# Patient Record
Sex: Male | Born: 1965 | Race: White | Hispanic: No | Marital: Single | Smoking: Current every day smoker
Health system: Southern US, Community
[De-identification: ages and names within clinical notes are randomized; demographics above are authoritative.]

## PROBLEM LIST (undated history)

## (undated) DIAGNOSIS — F419 Anxiety disorder, unspecified: Secondary | ICD-10-CM

## (undated) HISTORY — PX: CORONARY ANGIOPLASTY WITH STENT PLACEMENT: SHX49

---

## 2005-08-14 ENCOUNTER — Emergency Department: Payer: Self-pay | Admitting: Emergency Medicine

## 2005-08-14 ENCOUNTER — Other Ambulatory Visit: Payer: Self-pay

## 2008-03-08 ENCOUNTER — Emergency Department: Payer: Self-pay | Admitting: Emergency Medicine

## 2008-06-06 ENCOUNTER — Inpatient Hospital Stay: Payer: Self-pay | Admitting: Unknown Physician Specialty

## 2008-06-06 ENCOUNTER — Other Ambulatory Visit: Payer: Self-pay

## 2010-05-13 ENCOUNTER — Emergency Department: Payer: Self-pay

## 2010-08-18 ENCOUNTER — Emergency Department: Payer: Self-pay | Admitting: Emergency Medicine

## 2010-08-21 ENCOUNTER — Emergency Department: Payer: Self-pay | Admitting: Emergency Medicine

## 2011-08-27 IMAGING — CT CT HEAD WITHOUT CONTRAST
2 series · 16 of 30 positions shown, 20 images · non-contrast
Comparison: none

REASON FOR EXAM: headache
COMMENTS:   May transport without cardiac monitor

[Series 2: without · axial · non-contrast · 0.42mm/px · z∈[+130,+255]mm · 13 of 31 slices shown, 17 images]
[im 3/31  brain]
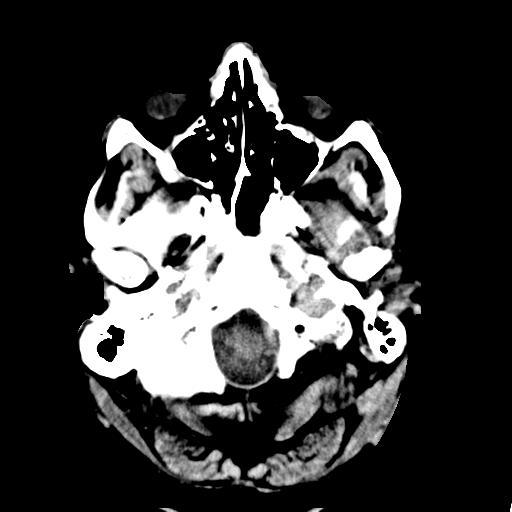
[im 3/31  bone]
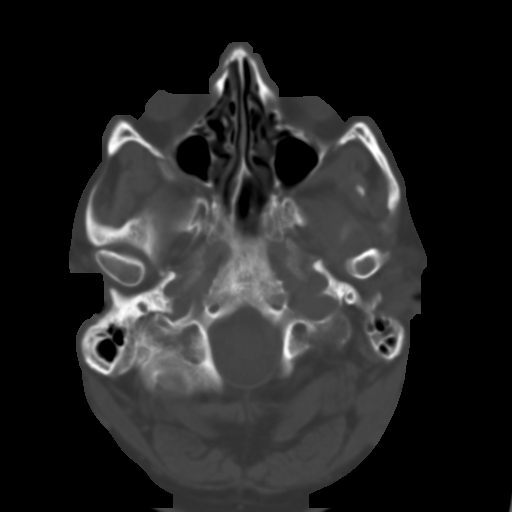
[im 5/31  brain]
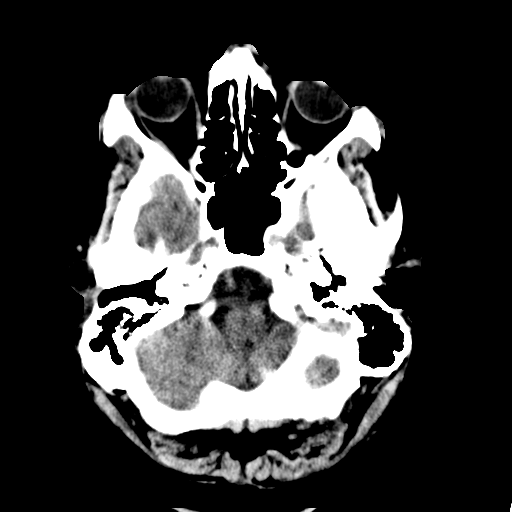
[im 7/31  brain]
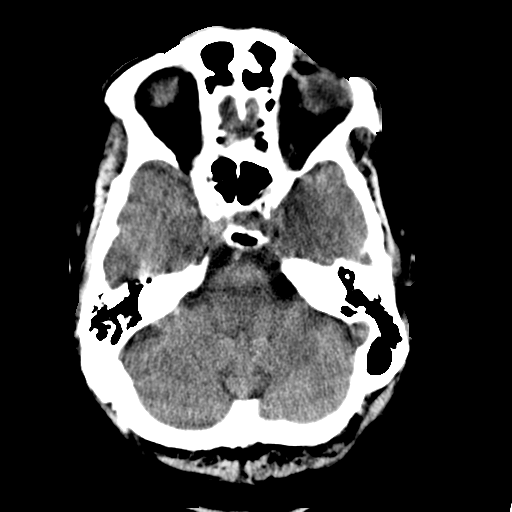
[im 9/31  brain]
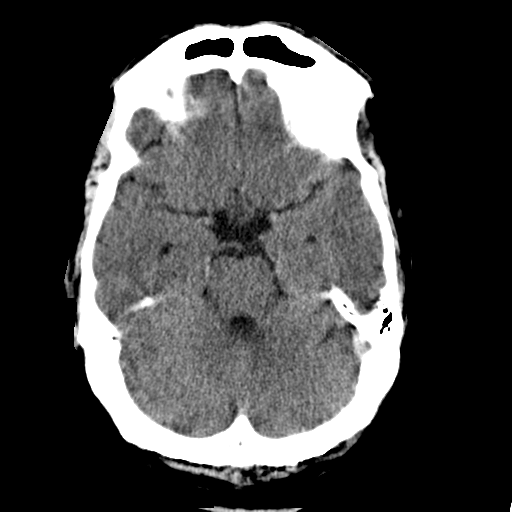
[im 11/31  brain]
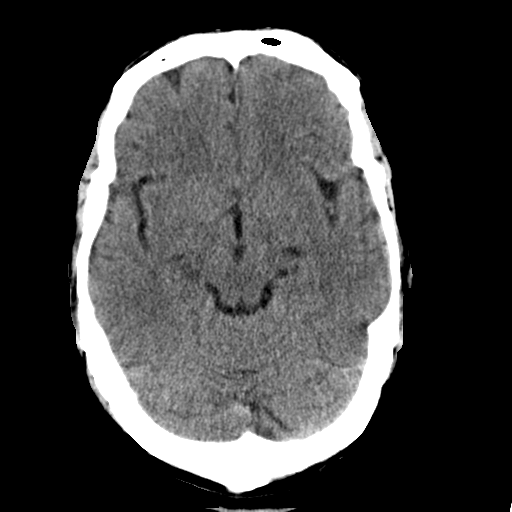
[im 11/31  bone]
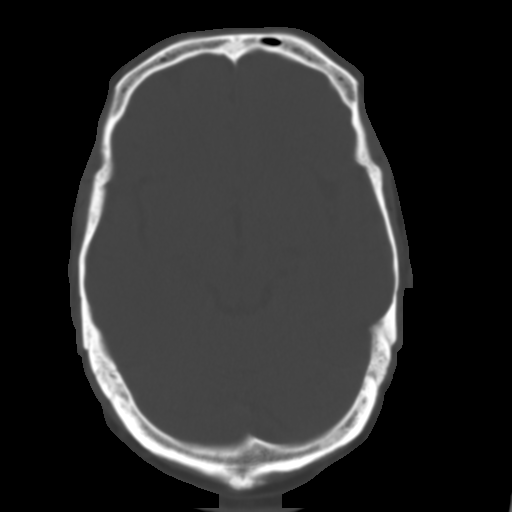
[im 13/31  brain]
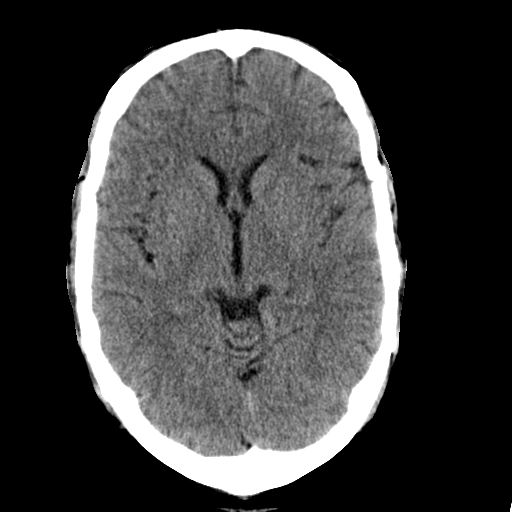
[im 16/31  brain]
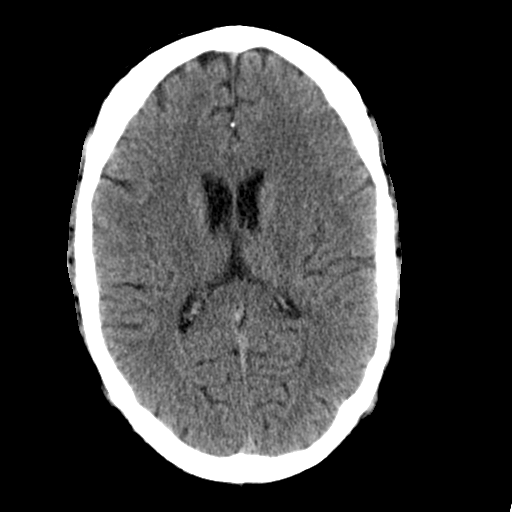
[im 18/31  brain]
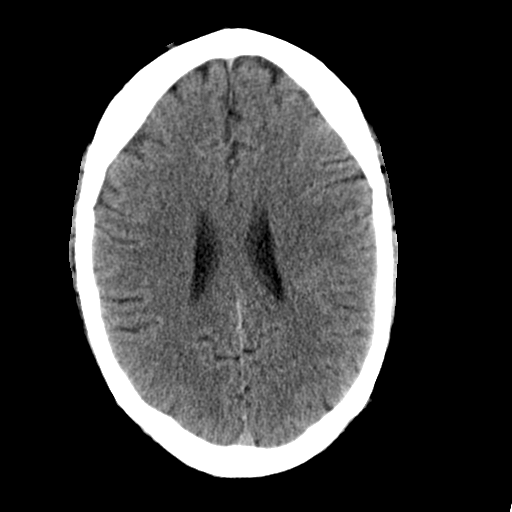
[im 20/31  brain]
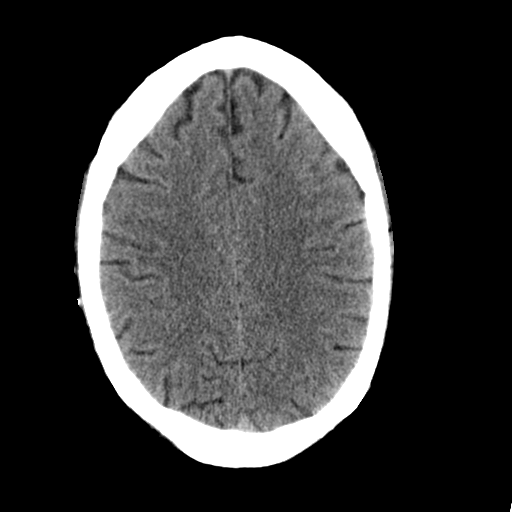
[im 20/31  bone]
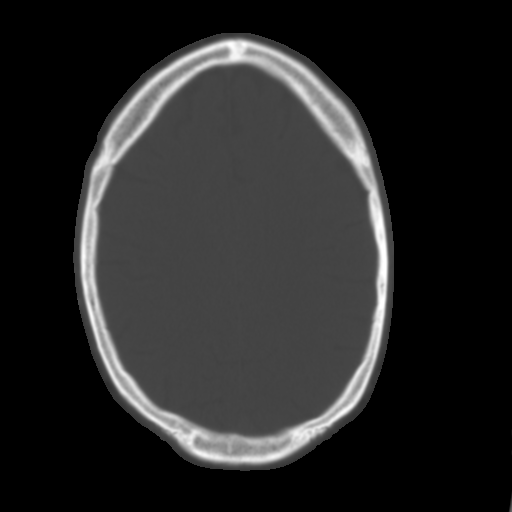
[im 22/31  brain]
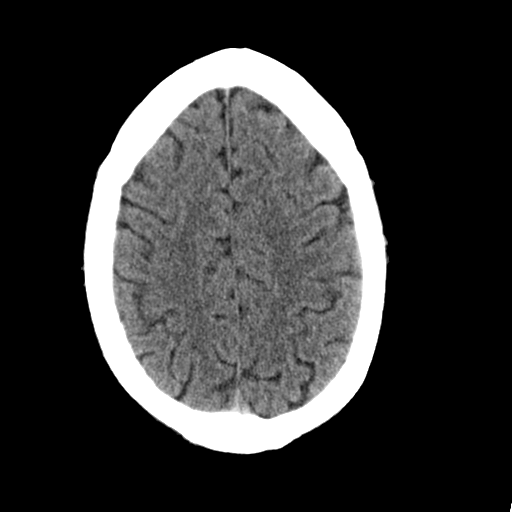
[im 24/31  brain]
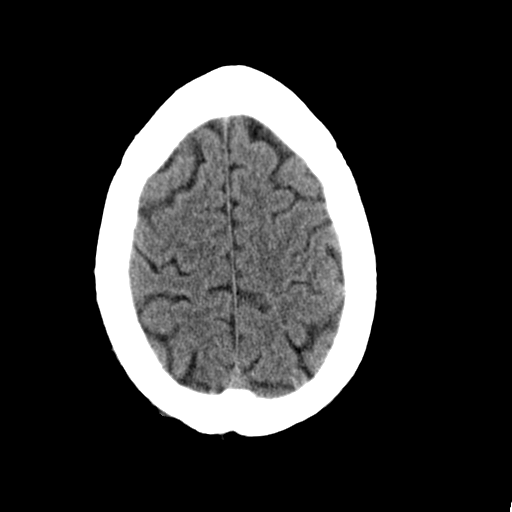
[im 26/31  brain]
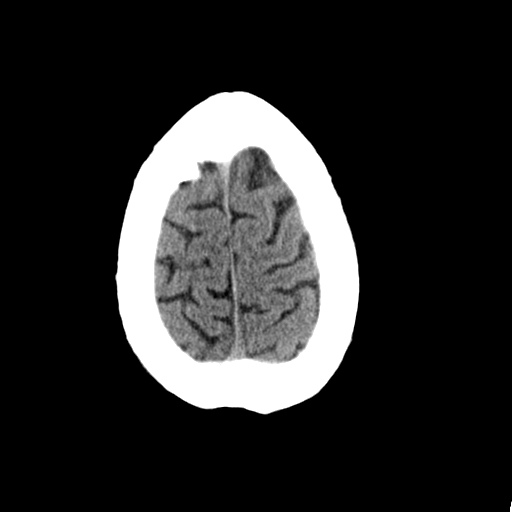
[im 28/31  brain]
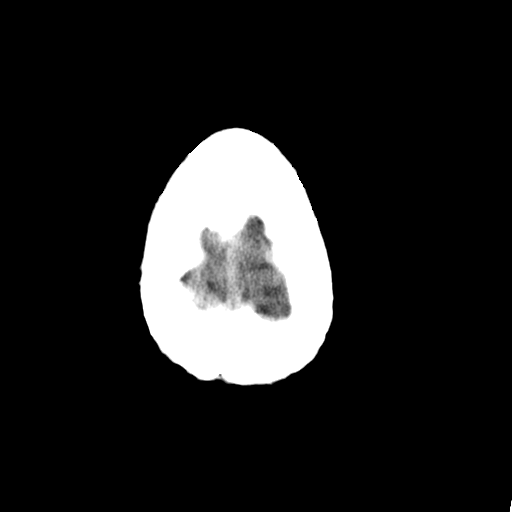
[im 28/31  bone]
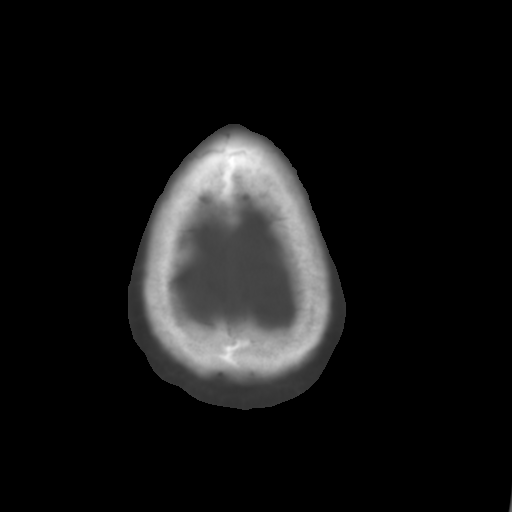

[Series 3: bone · axial · 0.42mm/px · z∈[+130,+170]mm · 3 of 31 slices shown]
[im 3/31  bone]
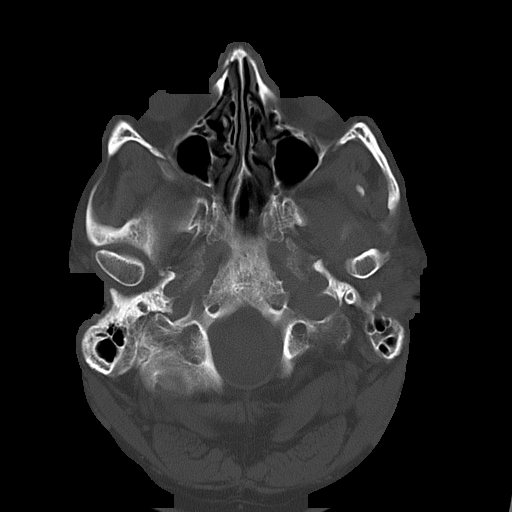
[im 7/31  bone]
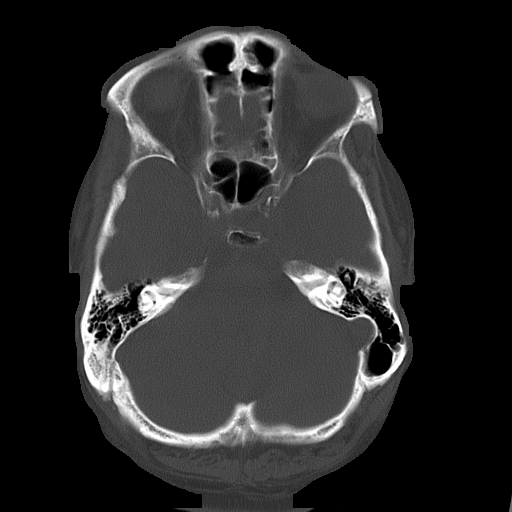
[im 11/31  bone]
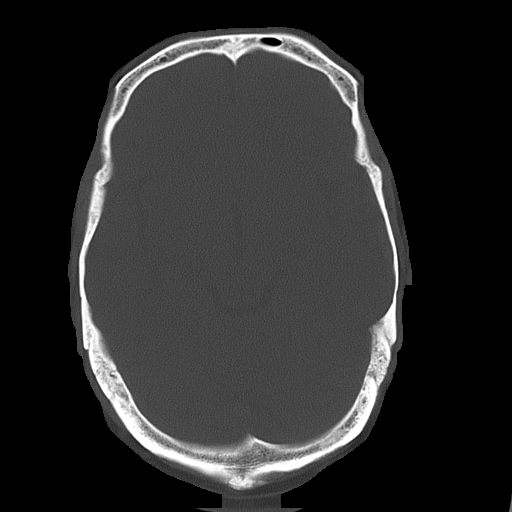

[16 of 30 positions shown; findings below may reference images not displayed]

PROCEDURE:     CT  - CT HEAD WITHOUT CONTRAST  - August 19, 2010  [DATE]

RESULT:     Noncontrast emergent CT of the brain is compared to the most
recent exam of 06/06/2008.

The ventricles and sulci are normal. There is no hemorrhage. There is no
focal mass, mass-effect or midline shift. There is no evidence of edema or
territorial infarct. The bone windows demonstrate normal aeration of the
paranasal sinuses and mastoid air cells. There is no skull fracture
demonstrated.
IMPRESSION: 1. No acute intracranial abnormality. Stable appearance.

## 2011-08-27 IMAGING — CR DG CHEST 2V
1 series · 2 of 2 positions shown · non-contrast
Comparison: none

REASON FOR EXAM: hypertension
COMMENTS:   May transport without cardiac monitor

PROCEDURE:     DXR - DXR CHEST PA (OR AP) AND LATERAL  - August 19, 2010  [DATE]
RESULT:     Comparison: 03/08/2008

[Series 1: view not recorded · 0.17mm/px · 2 of 2 slices shown]
[im 1/2]
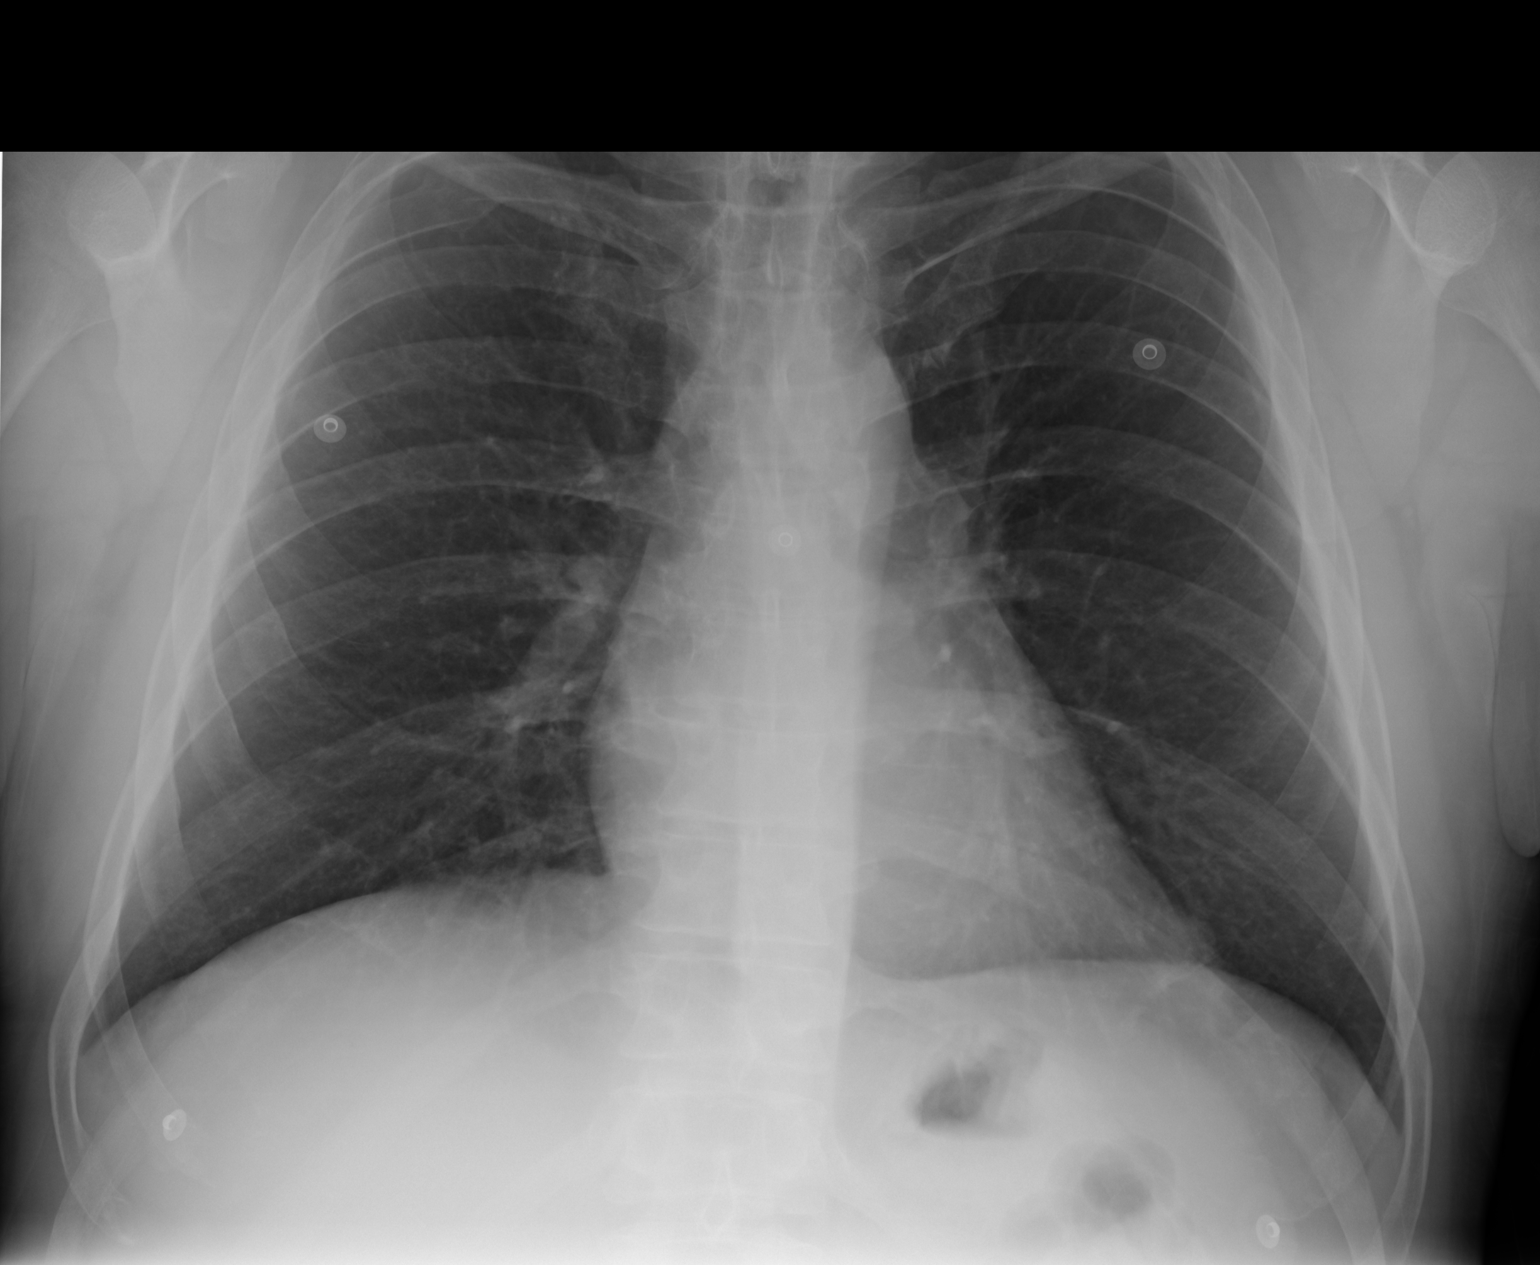
[im 2/2]
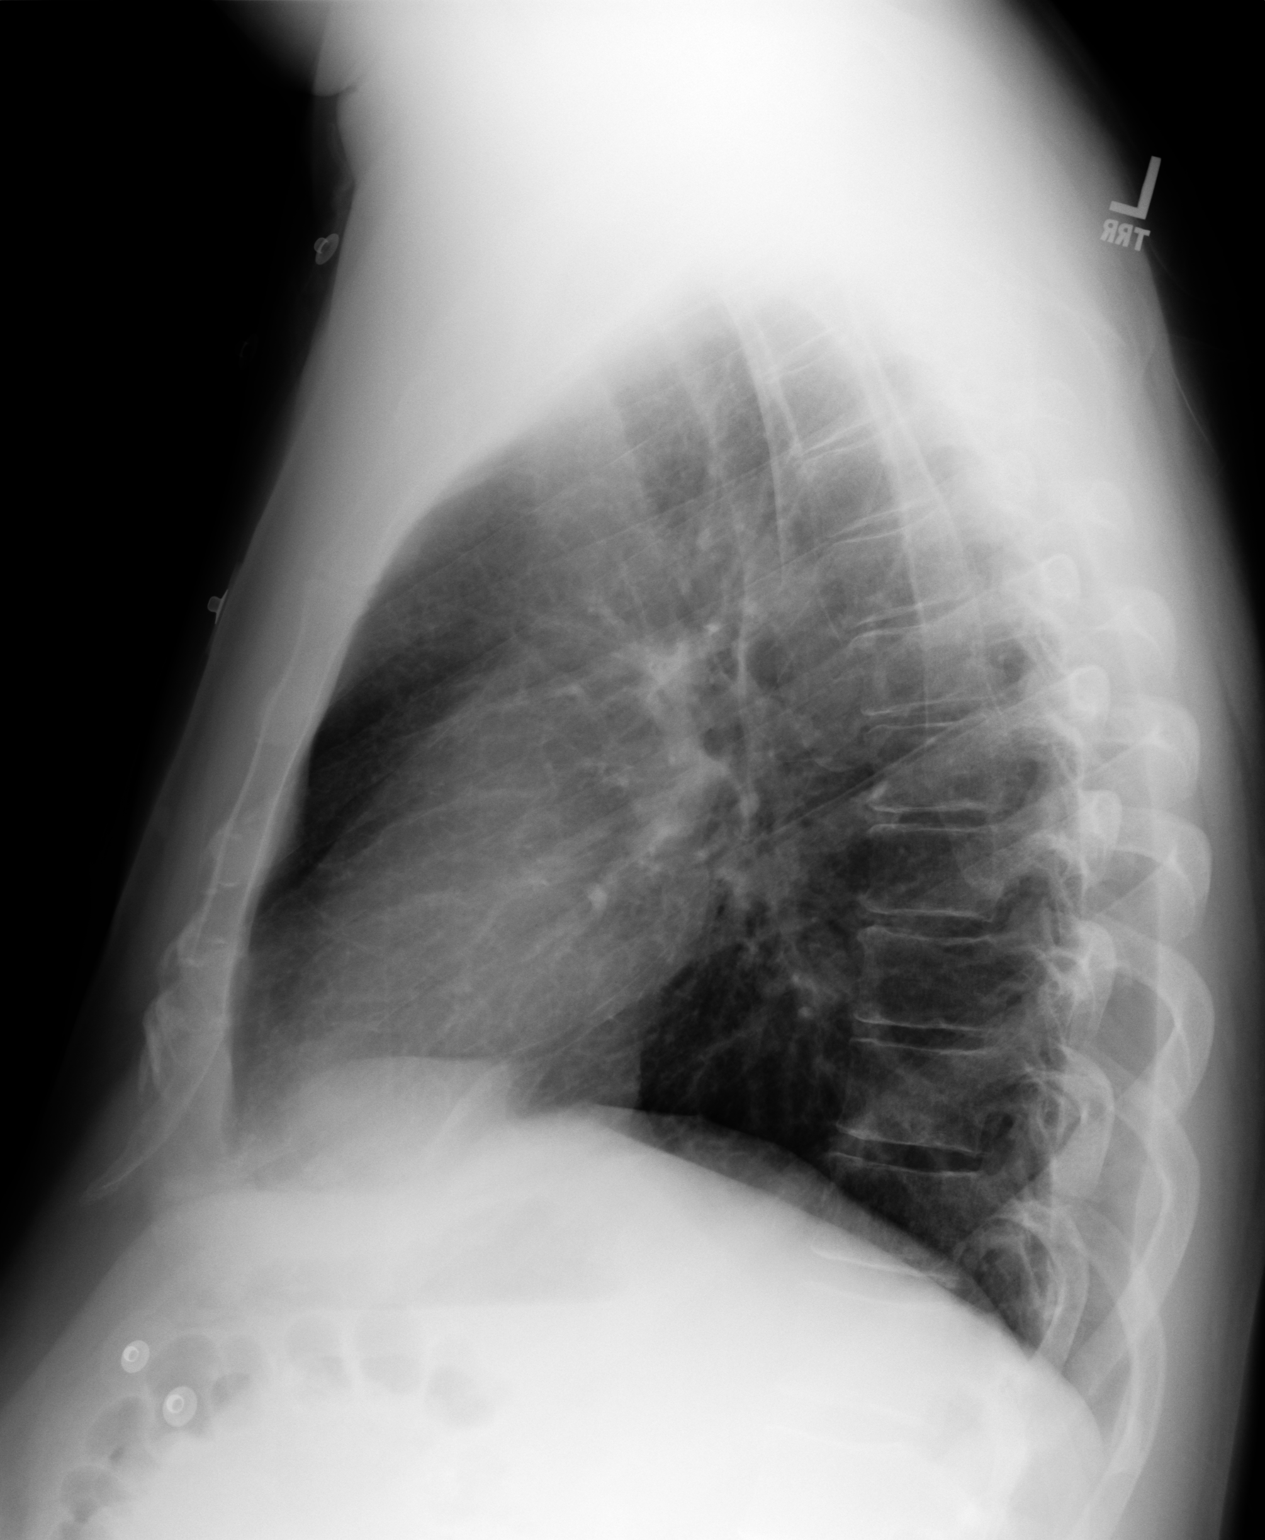

[2 of 2 positions shown; findings below may reference images not displayed]

FINDINGS: PA and lateral chest radiographs are provided. There is no focal parenchymal
opacity, pleural effusion, or pneumothorax. The heart and mediastinum are
unremarkable. The osseous structures are unremarkable.
IMPRESSION: No acute disease of the chest.

## 2011-08-29 IMAGING — CR DG CHEST 1V PORT
1 series · 1 of 1 positions shown · non-contrast
Comparison: none

REASON FOR EXAM: hypertension
COMMENTS:

PROCEDURE:     DXR - DXR PORTABLE CHEST SINGLE VIEW  - August 21, 2010  [DATE]
RESULT:     Comparison is made to the prior exam of 08/19/2010. The lung
fields are clear. The heart, mediastinal and osseous structures show no
significant abnormalities. Monitoring electrodes are present.

[view not recorded]
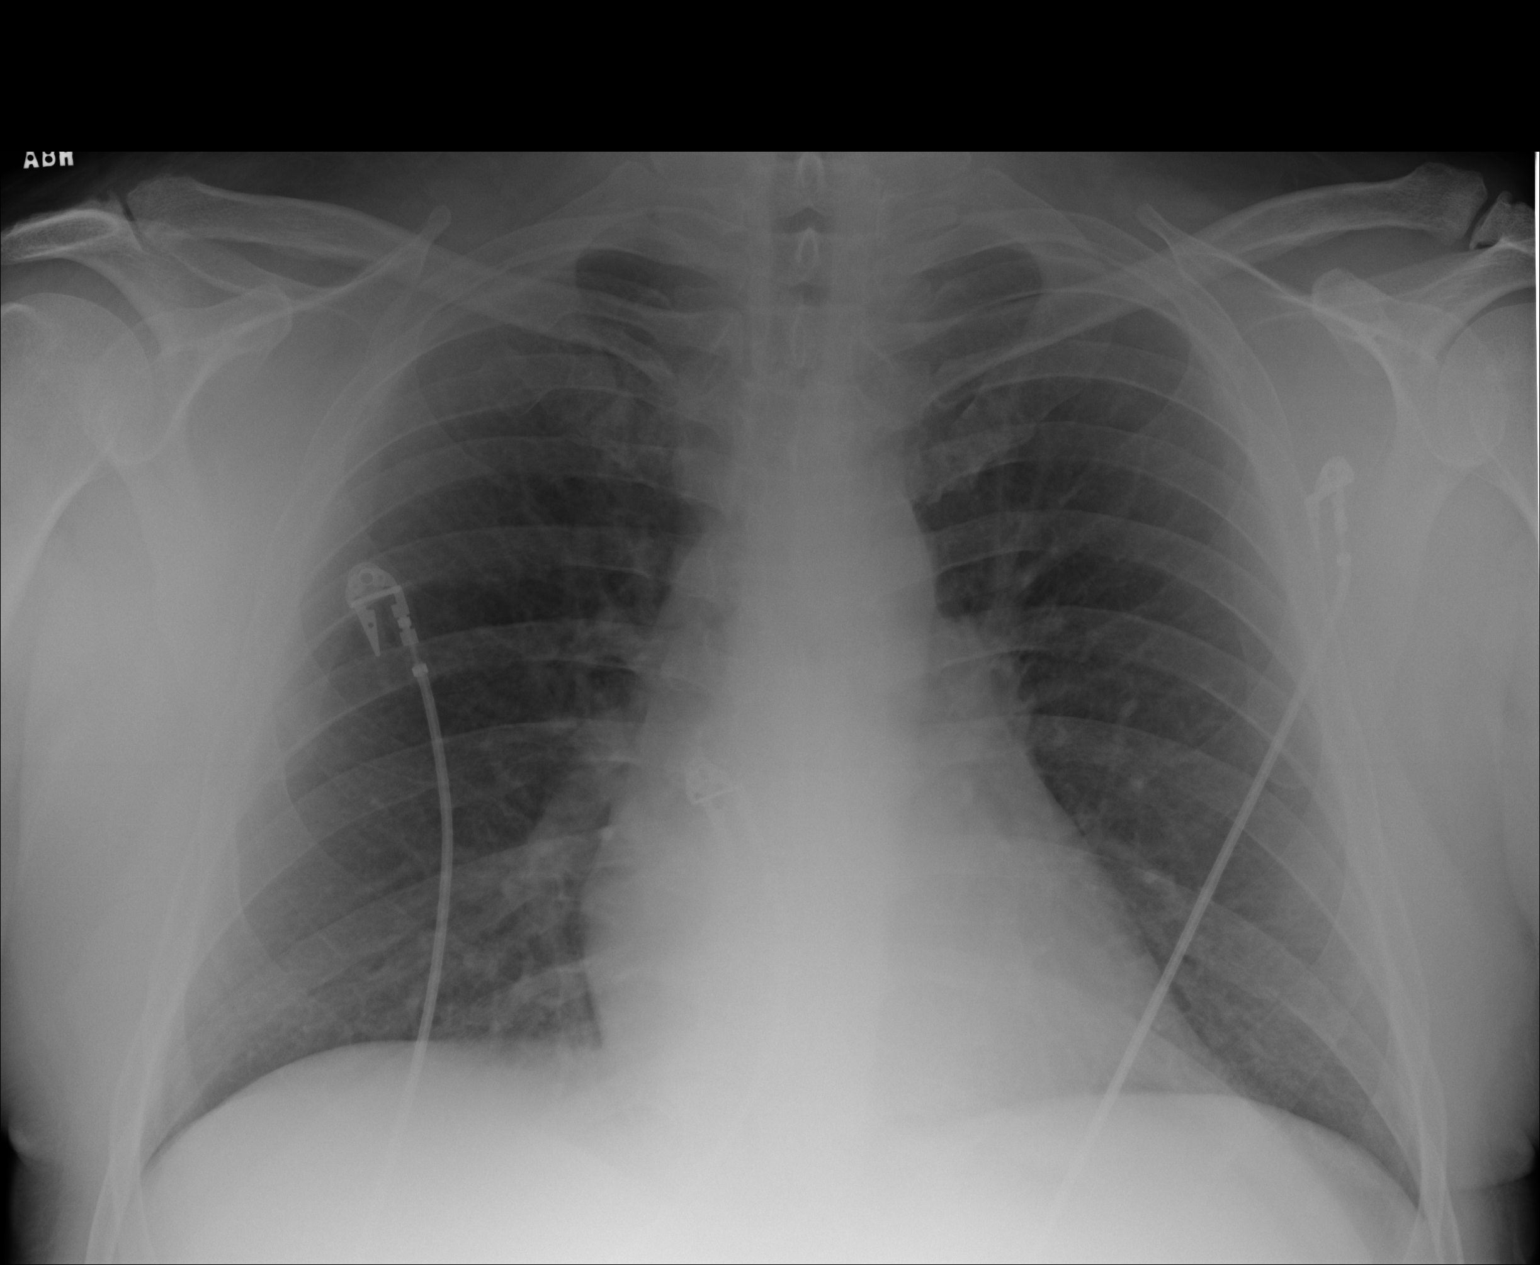

[1 of 1 positions shown; findings below may reference images not displayed]

IMPRESSION: 1.     No acute changes are identified.
2.     Heart size appears normal.

## 2012-06-26 ENCOUNTER — Emergency Department: Payer: Self-pay | Admitting: Emergency Medicine

## 2012-12-05 ENCOUNTER — Emergency Department: Payer: Self-pay | Admitting: Emergency Medicine

## 2012-12-05 LAB — ETHANOL
Ethanol %: <0.003 % (ref 0.000–0.080)
Ethanol: <3 mg/dL

## 2012-12-05 LAB — COMPREHENSIVE METABOLIC PANEL
Anion Gap: 9 (ref 7–16)
Calcium, Total: 8.3 mg/dL — ABNORMAL LOW (ref 8.5–10.1)
Co2: 26 mmol/L (ref 21–32)
EGFR (African American): >60
EGFR (Non-African Amer.): >60
Osmolality: 273 (ref 275–301)
Potassium: 3.7 mmol/L (ref 3.5–5.1)
SGOT(AST): 30 U/L (ref 15–37)
Sodium: 135 mmol/L — ABNORMAL LOW (ref 136–145)

## 2012-12-05 LAB — CBC WITH DIFFERENTIAL/PLATELET
Basophil %: 0.5 %
Eosinophil #: 0 10*3/uL (ref 0.0–0.7)
Eosinophil %: 0.6 %
Lymphocyte #: 2.3 10*3/uL (ref 1.0–3.6)
MCHC: 35.1 g/dL (ref 32.0–36.0)
MCV: 87 fL (ref 80–100)
Monocyte #: 0.6 x10 3/mm (ref 0.2–1.0)
Platelet: 116 10*3/uL — ABNORMAL LOW (ref 150–440)
RBC: 4.98 10*6/uL (ref 4.40–5.90)
WBC: 5.7 10*3/uL (ref 3.8–10.6)

## 2013-01-30 ENCOUNTER — Emergency Department: Payer: Self-pay | Admitting: Emergency Medicine

## 2013-01-30 LAB — CBC
MCH: 29.6 pg (ref 26.0–34.0)
MCV: 88 fL (ref 80–100)
Platelet: 143 10*3/uL — ABNORMAL LOW (ref 150–440)

## 2013-01-30 LAB — BASIC METABOLIC PANEL
Anion Gap: 10 (ref 7–16)
BUN: 10 mg/dL (ref 7–18)
Calcium, Total: 8.7 mg/dL (ref 8.5–10.1)
Chloride: 104 mmol/L (ref 98–107)
Creatinine: 0.85 mg/dL (ref 0.60–1.30)
EGFR (African American): >60
EGFR (Non-African Amer.): >60
Glucose: 185 mg/dL — ABNORMAL HIGH (ref 65–99)
Osmolality: 281 (ref 275–301)
Potassium: 3.6 mmol/L (ref 3.5–5.1)
Sodium: 139 mmol/L (ref 136–145)

## 2013-03-28 ENCOUNTER — Emergency Department: Payer: Self-pay | Admitting: Emergency Medicine

## 2013-03-28 LAB — CBC
HGB: 16.3 g/dL (ref 13.0–18.0)
MCH: 30.1 pg (ref 26.0–34.0)
MCHC: 34.6 g/dL (ref 32.0–36.0)
Platelet: 130 10*3/uL — ABNORMAL LOW (ref 150–440)
RBC: 5.4 10*6/uL (ref 4.40–5.90)

## 2013-03-28 LAB — BASIC METABOLIC PANEL
Anion Gap: 7 (ref 7–16)
BUN: 27 mg/dL — ABNORMAL HIGH (ref 7–18)
Calcium, Total: 8.5 mg/dL (ref 8.5–10.1)
Co2: 26 mmol/L (ref 21–32)
Creatinine: 1.3 mg/dL (ref 0.60–1.30)
EGFR (African American): >60
Glucose: 220 mg/dL — ABNORMAL HIGH (ref 65–99)
Potassium: 4.2 mmol/L (ref 3.5–5.1)

## 2013-05-17 ENCOUNTER — Emergency Department: Payer: Self-pay | Admitting: Emergency Medicine

## 2013-05-29 ENCOUNTER — Emergency Department: Payer: Self-pay | Admitting: Emergency Medicine

## 2014-04-11 ENCOUNTER — Emergency Department: Payer: Self-pay | Admitting: Emergency Medicine

## 2014-05-18 ENCOUNTER — Emergency Department: Payer: Self-pay | Admitting: Emergency Medicine

## 2014-05-18 LAB — COMPREHENSIVE METABOLIC PANEL
AST: 37 U/L (ref 15–37)
Albumin: 3.7 g/dL (ref 3.4–5.0)
Alkaline Phosphatase: 74 U/L
Anion Gap: 12 (ref 7–16)
BUN: 28 mg/dL — AB (ref 7–18)
Bilirubin,Total: 0.5 mg/dL (ref 0.2–1.0)
Calcium, Total: 8 mg/dL — ABNORMAL LOW (ref 8.5–10.1)
Chloride: 99 mmol/L (ref 98–107)
Co2: 25 mmol/L (ref 21–32)
Creatinine: 2.12 mg/dL — ABNORMAL HIGH (ref 0.60–1.30)
EGFR (African American): 41 — ABNORMAL LOW
EGFR (Non-African Amer.): 35 — ABNORMAL LOW
Glucose: 251 mg/dL — ABNORMAL HIGH (ref 65–99)
OSMOLALITY: 286 (ref 275–301)
POTASSIUM: 3.6 mmol/L (ref 3.5–5.1)
SGPT (ALT): 42 U/L (ref 12–78)
Sodium: 136 mmol/L (ref 136–145)
TOTAL PROTEIN: 7.5 g/dL (ref 6.4–8.2)

## 2014-05-18 LAB — ETHANOL: Ethanol %: <0.003 % (ref 0.000–0.080)

## 2014-05-18 LAB — CBC
HCT: 45.8 % (ref 40.0–52.0)
HGB: 15.4 g/dL (ref 13.0–18.0)
MCH: 29.6 pg (ref 26.0–34.0)
MCHC: 33.5 g/dL (ref 32.0–36.0)
MCV: 88 fL (ref 80–100)
PLATELETS: 142 10*3/uL — AB (ref 150–440)
RBC: 5.18 10*6/uL (ref 4.40–5.90)
RDW: 14.9 % — ABNORMAL HIGH (ref 11.5–14.5)
WBC: 7.3 10*3/uL (ref 3.8–10.6)

## 2014-05-19 LAB — URINALYSIS, COMPLETE
BACTERIA: NONE SEEN
Bilirubin,UR: NEGATIVE
Blood: NEGATIVE
Glucose,UR: 50 mg/dL (ref 0–75)
LEUKOCYTE ESTERASE: NEGATIVE
Nitrite: NEGATIVE
PH: 5 (ref 4.5–8.0)
SPECIFIC GRAVITY: 1.02 (ref 1.003–1.030)
Squamous Epithelial: NONE SEEN

## 2014-05-19 LAB — DRUG SCREEN, URINE
Amphetamines, Ur Screen: NEGATIVE (ref ?–1000)
BENZODIAZEPINE, UR SCRN: POSITIVE (ref ?–200)
Barbiturates, Ur Screen: NEGATIVE (ref ?–200)
COCAINE METABOLITE, UR ~~LOC~~: NEGATIVE (ref ?–300)
Cannabinoid 50 Ng, Ur ~~LOC~~: NEGATIVE (ref ?–50)
MDMA (Ecstasy)Ur Screen: NEGATIVE (ref ?–500)
METHADONE, UR SCREEN: NEGATIVE (ref ?–300)
OPIATE, UR SCREEN: NEGATIVE (ref ?–300)
PHENCYCLIDINE (PCP) UR S: NEGATIVE (ref ?–25)
Tricyclic, Ur Screen: NEGATIVE (ref ?–1000)

## 2015-04-05 ENCOUNTER — Emergency Department
Admit: 2015-04-05 | Disposition: A | Discharge status: Home / Self Care | Payer: Self-pay | Admitting: Emergency Medicine

## 2015-04-17 NOTE — Consult Note (Signed)
PATIENT NAME:  Ronnie Morse, Ronnie Morse MR#:  [id number] DATE OF BIRTH:  Aug 25, 1966  DATE OF CONSULTATION:  05/19/2014  REFERRING PHYSICIAN:  Governor Rooksebecca Lord, MD  CONSULTING PHYSICIAN:  Ardeen FillersUzma S. Garnetta BuddyFaheem, MD  REASON FOR CONSULTATION: "I just need to talk to somebody."   HISTORY OF PRESENT ILLNESS: The patient is a 49 year old single, Caucasian male who presented to the ED on a voluntary basis. He reported, "I need to talk to somebody as I'm upset about my family members." He reported that they were using back tar heroin and other drugs around the kids.  He stated that, "I called to talk to somebody at mental health, and nobody called me back." He stated that he was waiting from Monday to Friday and was not hearing from anyone from mental health, so he went and bought himself a half gallon of liquor and started drinking.   The patient reported that he was very upset, that he called his doctor on Tuesday, and told the receptionist he is waiting to get an appointment with Dr. Lucianne Morse in psychiatry. He was trying to set up an appointment with Dr. Lucianne Morse as he is currently following up with his primary care physician, Dr. Jacqulyn Morse in [place]. He stated that he was doing well and was staying sober. However, at 5:00 p.m. on Friday, he went out and bought himself some liquor. It was a big bottle and he drank it himself. He stated that he does not remember, but that he busted his head in the bathtub. On Sunday, he was feeling sick due to the head injuries. On Monday, he called Dr. Raelyn Morse's office and they put him through to the crisis line. She told him to come to the Emergency Room, and he drove himself here to the hospital.   He was evaluated through the televideo and they advised that he should be staying in the hospital. The patient reported that he takes Xanax 4 times daily and was doing well with Paxil in the past. He reported that he does not have any thoughts to hurt himself. He does not feel depressed or anxious, but wants to  start seeing Dr. Lucianne Morse on a regular basis so he can be managed on his medications. The patient reported that now he has relocated to this area [place] is his hometown, although he was living in [place] area in the past. He currently denied having any suicidal or homicidal ideations or plans.   PAST PSYCHIATRIC HISTORY: The patient reported that he has history of depression and anxiety in the past. He was prescribed Xanax 4 times daily, as well as Paxil in the past. He did well on the combination of both the medications, but has stopped taking Paxil for a long period of time. The patient reported that he has never attempted suicide.   SUBSTANCE ABUSE HISTORY: The patient reported that he drinks alcohol occasionally when he goes out for dinner with family or friends. However, he does not have any history of withdrawal seizures or blackouts. He has never been to a rehab program in the past.   FAMILY HISTORY: The patient reported that his sister and niece are alcoholics.   ALLERGIES: No known drug allergies.   MEDICAL HISTORY: The patient currently denied having any acute medical problems.   MEDICATIONS:  However, his medication list includes Tamiflu 75 mg twice daily, tramadol 50 mg every 4 hours for pain, cephalexin 500 mg 3 times daily, metronidazole 500 mg 6 hours and will finish in 5 days, penicillin  V 6 hours until finish for 7 days, Bystolic 20 mg daily, metformin 1000 mg 2 times daily, Nexium 40 mg daily, aspirin 81 mg once a day, clonidine 0.5 mg Morse.i.d., alprazolam 1 mg 4 times daily, pravastatin 80 mg once a day, acetaminophen/hydrocodone 1 tablet every 6 hours as needed for pain, Edarbyclor 40 mg/12.5 mg daily, ibuprofen 800 mg 3 times daily, nitroglycerin 0.4 mg every 5 minutes as needed for chest pain.   SOCIAL HISTORY: The patient reported that he currently lives by himself. He was incarcerated for 2 years  Does not have any pending legal charges.   REVIEW OF SYSTEMS: CONSTITUTIONAL: Denies  any fever or chills. No weight changes.  EYES: No double or blurred vision.  RESPIRATORY: No shortness of breath or cough.  CARDIOVASCULAR: No chest pain or orthopnea.  GASTROINTESTINAL: No abdominal pain, nausea, vomiting or diarrhea.  GENITOURINARY: No incontinence or frequency.  ENDOCRINE: No heat or cold intolerance.  LYMPHATIC: No anemia or easy bruising.  INTEGUMENTARY: No acne or rash.  MUSCULOSKELETAL: No muscle or joint pain.   VITAL SIGNS: Temperature 97.8, pulse 88, respirations 18, blood pressure 113/82.  LABORATORY DATA: Glucose 134, BUN 28, creatinine 2.12, sodium 136, potassium 3.6, chloride 99, bicarbonate 25, GFR 41, anion gap 12, osmolality 286, calcium 8. Blood alcohol level less than 3. Protein 7.5, albumin 3.7, alkaline phosphatase 274, AST 37, ALT 42. UDS is positive for benzodiazepines. WBC 7.3, RBC 5.18, hemoglobin 15.4, platelet count 142, RDW 14.9.   MENTAL STATUS EXAMINATION: The patient is a moderately built male who appeared his stated age. He was well nourished. He maintained fair eye contact. Muscle tone appears normal. Gait and station appear within normal limits. Speech was normal in tone and volume. Thought process was logical, goal-directed. Thought content was non-delusional. No loose associations are noted. No suicidal or homicidal ideations or plans are noted. Awake, alert and oriented x 3. Recent and remote memory were intact. Attention span and concentration were normal. Language and fund of knowledge are appropriate.   DIAGNOSTIC IMPRESSION:  AXIS I:  1. Major depressive disorder, recurrent, moderate.  2. Alcohol abuse.   TREATMENT PLAN: The patient will be started on Paxil 20 mg p.o. at bedtime and continued on Xanax 1 mg p.o. t.i.d. I advised the patient to call or come for an early appointment if he noticed worsening of his symptoms and he demonstrated understanding. He will follow up with his primary care physician in the community.   Thank you for  allowing me to participate in the care of this patient.    ____________________________ Ardeen Fillers. Garnetta Buddy, MD usf:es D: 05/19/2014 12:53:30 ET T: 05/19/2014 13:18:34 ET JOB#: [id number]  cc: Ardeen Fillers. Garnetta Buddy, MD, <Dictator> Rhunette Croft MD ELECTRONICALLY SIGNED 05/26/2014 15:59

## 2017-06-19 ENCOUNTER — Encounter: Payer: Self-pay | Admitting: Emergency Medicine

## 2017-06-19 ENCOUNTER — Emergency Department
Admission: EM | Admit: 2017-06-19 | Discharge: 2017-06-19 | Disposition: A | Discharge status: Home / Self Care | Payer: Medicare HMO | Attending: Emergency Medicine | Admitting: Emergency Medicine

## 2017-06-19 DIAGNOSIS — F1320 Sedative, hypnotic or anxiolytic dependence, uncomplicated: Secondary | ICD-10-CM | POA: Diagnosis not present

## 2017-06-19 DIAGNOSIS — R079 Chest pain, unspecified: Secondary | ICD-10-CM | POA: Diagnosis present

## 2017-06-19 DIAGNOSIS — F17210 Nicotine dependence, cigarettes, uncomplicated: Secondary | ICD-10-CM | POA: Insufficient documentation

## 2017-06-19 DIAGNOSIS — F13930 Sedative, hypnotic or anxiolytic use, unspecified with withdrawal, uncomplicated: Secondary | ICD-10-CM

## 2017-06-19 DIAGNOSIS — F13230 Sedative, hypnotic or anxiolytic dependence with withdrawal, uncomplicated: Secondary | ICD-10-CM

## 2017-06-19 HISTORY — DX: Anxiety disorder, unspecified: F41.9

## 2017-06-19 LAB — CBC
HCT: 41.7 % (ref 40.0–52.0)
Hemoglobin: 14.8 g/dL (ref 13.0–18.0)
MCH: 28.5 pg (ref 26.0–34.0)
MCHC: 35.5 g/dL (ref 32.0–36.0)
MCV: 80.5 fL (ref 80.0–100.0)
PLATELETS: 180 10*3/uL (ref 150–440)
RBC: 5.18 MIL/uL (ref 4.40–5.90)
RDW: 12.9 % (ref 11.5–14.5)
WBC: 12 10*3/uL — AB (ref 3.8–10.6)

## 2017-06-19 LAB — COMPREHENSIVE METABOLIC PANEL
ALT: 23 U/L (ref 17–63)
AST: 26 U/L (ref 15–41)
Albumin: 4.2 g/dL (ref 3.5–5.0)
Alkaline Phosphatase: 41 U/L (ref 38–126)
Anion gap: 10 (ref 5–15)
BUN: 19 mg/dL (ref 6–20)
CHLORIDE: 96 mmol/L — AB (ref 101–111)
CO2: 26 mmol/L (ref 22–32)
Calcium: 9.4 mg/dL (ref 8.9–10.3)
Creatinine, Ser: 1.11 mg/dL (ref 0.61–1.24)
GFR calc Af Amer: >60 mL/min (ref >60)
Glucose, Bld: 141 mg/dL — ABNORMAL HIGH (ref 65–99)
POTASSIUM: 4.2 mmol/L (ref 3.5–5.1)
SODIUM: 132 mmol/L — AB (ref 135–145)
Total Bilirubin: 0.9 mg/dL (ref 0.3–1.2)
Total Protein: 7.5 g/dL (ref 6.5–8.1)

## 2017-06-19 LAB — TROPONIN I

## 2017-06-19 LAB — ETHANOL

## 2017-06-19 MED ORDER — CHLORDIAZEPOXIDE HCL 25 MG PO CAPS
25.0000 mg | ORAL_CAPSULE | Freq: Once | ORAL | Status: AC
Start: 2017-06-19 — End: 2017-06-19
  Administered 2017-06-19: 25 mg via ORAL

## 2017-06-19 MED ORDER — CHLORDIAZEPOXIDE HCL 25 MG PO CAPS
ORAL_CAPSULE | ORAL | Status: AC
  Filled 2017-06-19: qty 1

## 2017-06-19 MED ORDER — CHLORDIAZEPOXIDE HCL 10 MG PO CAPS: ORAL_CAPSULE | ORAL | 0 refills | Status: AC

## 2017-06-19 MED ORDER — CHLORDIAZEPOXIDE HCL 10 MG PO CAPS: 10.0000 mg | ORAL_CAPSULE | Freq: Once | ORAL | Status: DC

## 2017-06-19 NOTE — Discharge Instructions (Signed)
Please follow-up with her primary care doctor says possible regarding her likely withdrawal symptoms. Return to the department for any personally concerning symptoms.

## 2017-06-19 NOTE — ED Triage Notes (Addendum)
Pt to triage via w/c with no distress noted; pt reports has been weaning off of xanax and klonopin and has severe anxiety disorder (has been on for last 30years) and has stopped taking them 7 days ago at his physician's advice to get off narcotics; and now feeling, "jerky" and anxious

## 2017-06-19 NOTE — ED Provider Notes (Signed)
[facility] Emergency Department Provider Note  Time seen: 8:57 PM  I have reviewed the triage vital signs and the nursing notes.   HISTORY  Chief Complaint Withdrawal    HPI Ronnie LungJack B Powless Jr. is a 51 y.o. male for the past medical history of bipolar, anxiety, presents to the emergency department for withdrawal symptoms. According to the patient he has been working with his primary care doctor since March to get off of Klonopin. He has been slowly tapering his doses down. He states he last took Klonopin 7 days ago however over the past 3 or 4 days he has been having uncontrolled shaking/jerking. He is also been experiencing some central chest discomfort which she describes as pressure like he needs to belch. Denies any trouble breathing, nausea or diaphoresis. States mild amount of diarrhea over the past few days as well. Denies any alcohol use. Denies any drug use. Denies any SI or HI.  Past Medical History:  Diagnosis Date  . Anxiety     There are no active problems to display for this patient.   Past Surgical History:  Procedure Laterality Date  . CORONARY ANGIOPLASTY WITH STENT PLACEMENT      Prior to Admission medications   Not on File    No Known Allergies  No family history on file.  Social History Social History  Substance Use Topics  . Smoking status: Current Every Day Smoker    Packs/day: 1.00    Types: Cigarettes  . Smokeless tobacco: Never Used  . Alcohol use Yes     Comment: occasional    Review of Systems Constitutional: Negative for fever. Cardiovascular: Mild chest discomfort. Respiratory: Negative for shortness of breath. Gastrointestinal: Negative for abdominal pain Neurological: Negative for headache All other ROS negative  ____________________________________________   PHYSICAL EXAM:  VITAL SIGNS: ED Triage Vitals [06/19/17 1917]  Enc Vitals Group     BP 132/89     Pulse Rate 90     Resp 18     Temp 98 F  (36.7 C)     Temp Source Oral     SpO2 98 %     Weight 211 lb (95.7 kg)     Height 5\' 10"  (1.778 m)     Head Circumference      Peak Flow      Pain Score      Pain Loc      Pain Edu?      Excl. in GC?     Constitutional: AlertAlert, oriented, no distress with occasional jerking throughout examination. Eyes: Normal exam ENT   Head: Normocephalic and atraumatic   Mouth/Throat: Mucous membranes are moist. Cardiovascular: Normal rate, regular rhythm. No murmur Respiratory: Normal respiratory effort without tachypnea nor retractions. Breath sounds are clear  Gastrointestinal: Soft and nontender. No distention.  Musculoskeletal: Nontender with normal range of motion in all extremities.  Neurologic:  Normal speech and language. No gross focal neurologic deficits  Skin:  Skin is warm, dry and intact.  Psychiatric: Mood and affect are normal.   ____________________________________________    EKG  EKG reviewed and interpreted, so shows normal sinus rhythm at 63 bpm, narrow QRS, normal axis, normal intervals, no ST changes. Normal EKG.  ____________________________________________    INITIAL IMPRESSION / ASSESSMENT AND PLAN / ED COURSE  Pertinent labs & imaging results that were available during my care of the patient were reviewed by me and considered in my medical decision making (see chart for details).  The  patient presents to the emergency department with symptoms that just above which are all which she describes as jerking occasional chest discomfort and diarrhea. Patient took his last Klonopin 7 days ago, symptoms developed approximately 3-4 days ago. Overall the patient appears well, Cyril Mourning negative review of systems a besides intermittent chest discomfort. We will check labs including cardiac enzymes, EKG. I suspect likely benzodiazepine withdrawal. Discussed with the patient that we should not put him back on Klonopin, the patient is agreeable. However I believe the  patient would benefit from a taper of Librium and follow up with his PCP. The patient is agreeable to this plan.  Patient's labs have resulted largely within normal limits. Troponin is negative. We will discharge the patient on a course of Librium and have him follow-up with his primary care doctor. Patient agreeable to plan. ____________________________________________   FINAL CLINICAL IMPRESSION(S) / ED DIAGNOSES  Benzodiazepine withdrawal    Minna Antis, MD 06/19/17 2143
